# Patient Record
Sex: Male | Born: 1960 | Race: White | Hispanic: No | Smoking: Former smoker
Health system: Southern US, Community
[De-identification: ages and names within clinical notes are randomized; demographics above are authoritative.]

## PROBLEM LIST (undated history)

## (undated) DIAGNOSIS — F419 Anxiety disorder, unspecified: Secondary | ICD-10-CM

## (undated) DIAGNOSIS — E291 Testicular hypofunction: Secondary | ICD-10-CM

## (undated) DIAGNOSIS — M109 Gout, unspecified: Secondary | ICD-10-CM

## (undated) DIAGNOSIS — E785 Hyperlipidemia, unspecified: Secondary | ICD-10-CM

## (undated) DIAGNOSIS — F329 Major depressive disorder, single episode, unspecified: Secondary | ICD-10-CM

## (undated) DIAGNOSIS — I10 Essential (primary) hypertension: Secondary | ICD-10-CM

## (undated) DIAGNOSIS — F32A Depression, unspecified: Secondary | ICD-10-CM

## (undated) HISTORY — DX: Hyperlipidemia, unspecified: E78.5

## (undated) HISTORY — DX: Major depressive disorder, single episode, unspecified: F32.9

## (undated) HISTORY — DX: Essential (primary) hypertension: I10

## (undated) HISTORY — DX: Depression, unspecified: F32.A

## (undated) HISTORY — DX: Anxiety disorder, unspecified: F41.9

## (undated) HISTORY — DX: Gout, unspecified: M10.9

## (undated) HISTORY — DX: Testicular hypofunction: E29.1

---

## 1999-02-07 ENCOUNTER — Ambulatory Visit (HOSPITAL_COMMUNITY): Admission: RE | Admit: 1999-02-07 | Discharge: 1999-02-07 | Payer: Self-pay | Admitting: Internal Medicine

## 1999-02-07 ENCOUNTER — Encounter: Payer: Self-pay | Admitting: Internal Medicine

## 2001-05-22 HISTORY — PX: ORIF ANKLE FRACTURE: SHX5408

## 2001-07-19 ENCOUNTER — Encounter: Payer: Self-pay | Admitting: Emergency Medicine

## 2001-07-20 ENCOUNTER — Encounter: Payer: Self-pay | Admitting: Orthopedic Surgery

## 2001-07-20 ENCOUNTER — Inpatient Hospital Stay (HOSPITAL_COMMUNITY): Admission: EM | Admit: 2001-07-20 | Discharge: 2001-07-21 | Payer: Self-pay | Admitting: Orthopedic Surgery

## 2006-07-21 ENCOUNTER — Emergency Department (HOSPITAL_COMMUNITY): Admission: EM | Admit: 2006-07-21 | Discharge: 2006-07-21 | Payer: Self-pay | Admitting: *Deleted

## 2006-07-23 ENCOUNTER — Ambulatory Visit (HOSPITAL_COMMUNITY): Admission: RE | Admit: 2006-07-23 | Discharge: 2006-07-23 | Payer: Self-pay | Admitting: Orthopedic Surgery

## 2006-08-31 ENCOUNTER — Encounter (INDEPENDENT_AMBULATORY_CARE_PROVIDER_SITE_OTHER): Payer: Self-pay | Admitting: Specialist

## 2006-08-31 ENCOUNTER — Ambulatory Visit (HOSPITAL_BASED_OUTPATIENT_CLINIC_OR_DEPARTMENT_OTHER): Admission: RE | Admit: 2006-08-31 | Discharge: 2006-08-31 | Payer: Self-pay | Admitting: Otolaryngology
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
## 2007-06-04 IMAGING — CR DG ORBITS FOR FOREIGN BODY
2 series · 2 of 2 positions shown · non-contrast
Comparison: none

CLINICAL DATA: Pre-MRI, screening examination.
 ORBITS - 2 VIEW:

[view not recorded (1 of 2)]
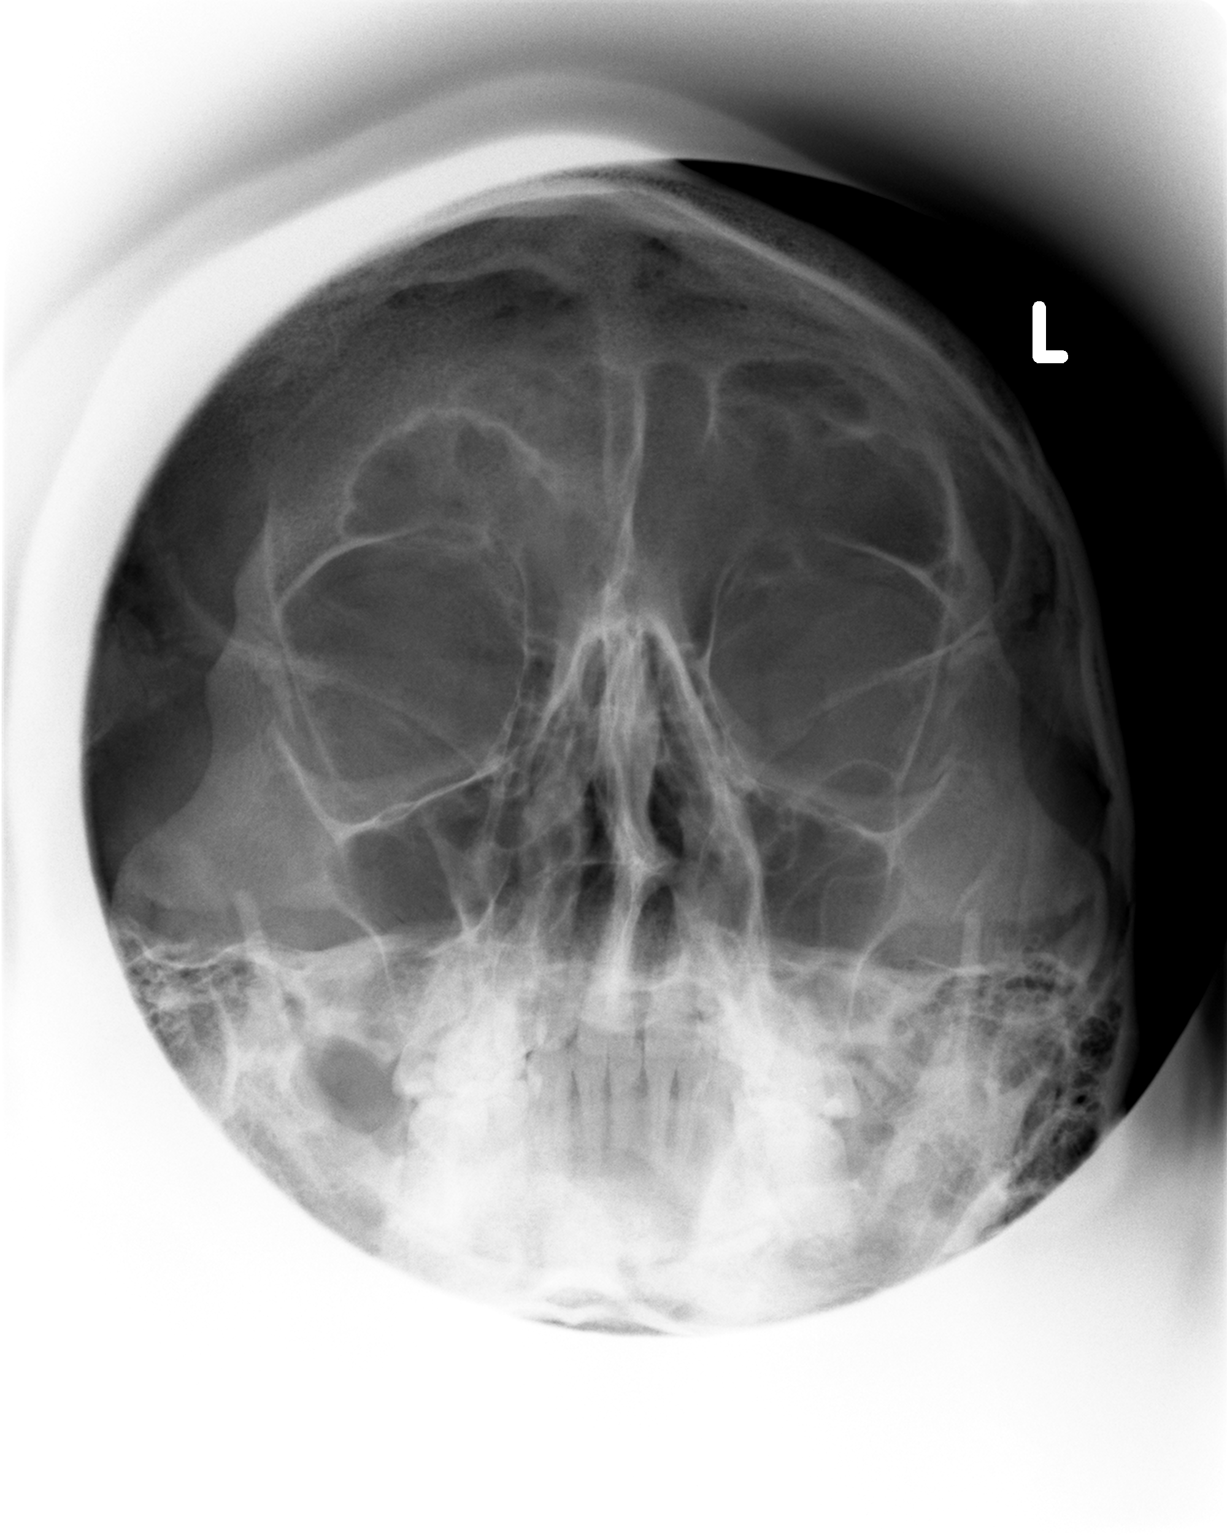

[view not recorded (2 of 2)]
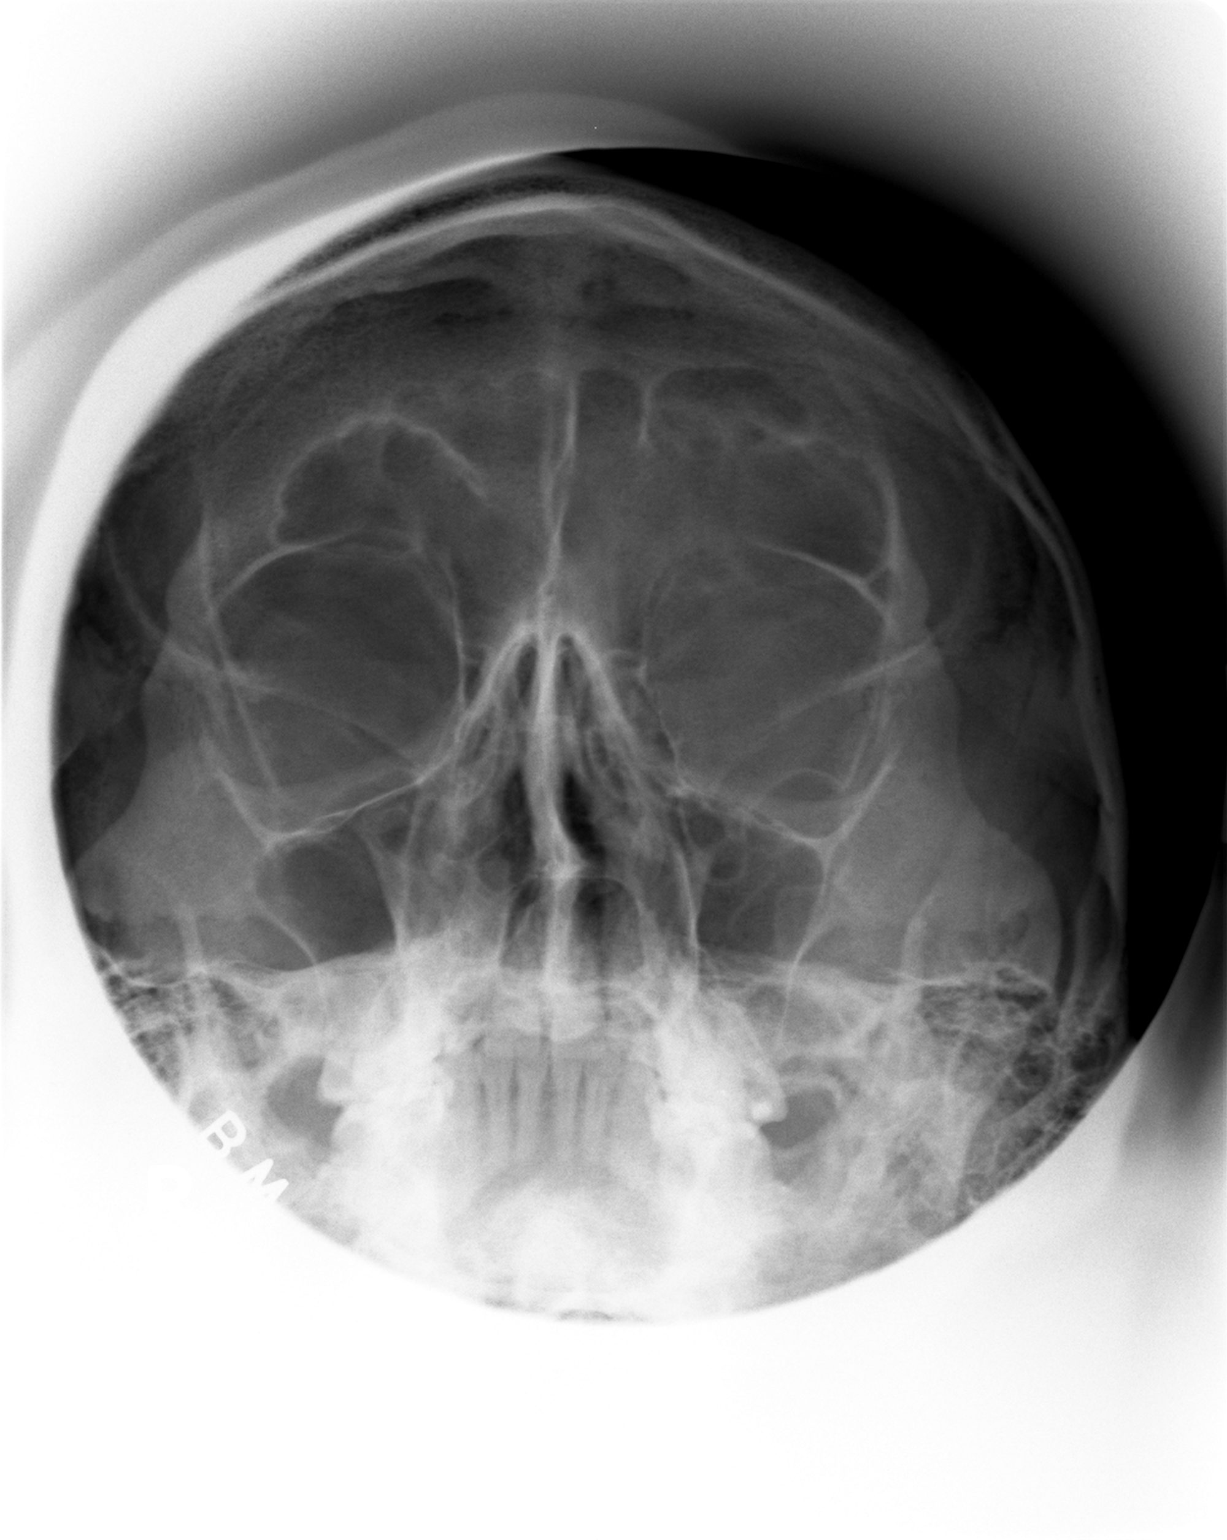

[2 of 2 positions shown; findings below may reference images not displayed]

FINDINGS: No radiopaque foreign body.
IMPRESSION: Patient may proceed to MRI.

## 2010-10-07 NOTE — Op Note (Signed)
[place]. [facility]  Patient:    [name], [name] Visit Number: [id number] MRN: [id number]          Service Type: SUR Location: 5000 5023 [id number] Attending Physician:  Burnard Bunting Dictated by:   Cammy Copa, M.D. Proc. Date: 07/20/01 Admit Date:  07/20/2001 Discharge Date: 07/21/2001                             Operative Report  PREOPERATIVE DIAGNOSIS:   Right ankle fracture.  POSTOPERATIVE DIAGNOSIS:  Right ankle fracture.  OPERATION:  Right ankle fracture open reduction and internal fixation.  SURGEON:  Cammy Copa, M.D.  ANESTHESIA:  General endotracheal.  ESTIMATED BLOOD LOSS:   20 cc.  DRAINS:  None.  TOURNIQUET TIME:  59 minutes at 300 mmHg.  INDICATIONS:  Trevor Reynolds is a 50 year old patient who injured his right ankle.  He presents now for operative management.  DESCRIPTION OF PROCEDURE:  The patient was brought to the operating room where general endotracheal anesthesia.  Preoperative IV antibiotics were administered.  The right leg was prepped with DuraPrep solution and draped in a sterile manner.  The operative field was then covered with a Ioban.  The leg was elevated and exsanguinated with the Esmarch.  The tourniquet was inflated. A lateral incision was made at 0.9 cm proximal to the fibula.  The fibula was marked.  The skin and subcutaneous tissues were sharply divided 9 cm proximal to the fibular tip.  Care was taken to avoid the superficial peroneal nerve sensory branch.  The periosteum was elevated anteriorly and posteriorly.  The fracture site was visualized.  It was reduced under direct visualization in an anatomic fashion.  Two lag screws were placed proximal anterior to distal posterior.  Excellent compression was achieved.  An 8-hole one-third tubular plate was then fashioned as a neutralization plate.  Blocking plate was used for optimal stability.  The construct was checked in the AP and lateral  plains and anatomic reduction was confirmed.  Pull test on the fibula demonstrated intact syndesmosis.  The mortise was symmetric on the AP and lateral views. The tourniquet was released.  Bleeding points were encountered and controlled using electrocautery.  The periosteum was closed over the plate.  This was done using 3-0 Vicryl suture.  Inverted interrupted 3-0 Vicryl was then used to reapproximate the skin edges; 3-0 nylon was used to reapproximate the skin itself.  The patient was then placed in a bulky posterior splint.  The patient tolerated the procedure well without immediate complications. Dictated by:   Cammy Copa, M.D. Attending Physician:  Burnard Bunting DD:  07/20/01 TD:  07/22/01 Job: [id number]
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
## 2010-10-07 NOTE — Op Note (Signed)
NAMEKEANU, LESNIAK NO.:  [id number]   MEDICAL RECORD NO.:  [id number]           PATIENT TYPE:   LOCATION:                                 FACILITY:   PHYSICIAN:  Christopher E. Ezzard Standing, M.D.DATE OF BIRTH:  [birth year]   DATE OF PROCEDURE:  08/31/2006  DATE OF DISCHARGE:                               OPERATIVE REPORT   PREOP DIAGNOSIS:  Left postauricular mass consistent with lipoma.   POSTOP DIAGNOSIS:  Left postauricular mass consistent with lipoma.   OPERATIONS:  Excision of left postauricular mass, 3-4-cm size.   SURGEON:  Narda Bonds, M.D.   ANESTHESIA:  Local, 1% Xylocaine with epinephrine.   COMPLICATIONS:  None.   BRIEF CLINICAL NOTE:  Trevor Reynolds is a 50 year old gentleman who  presents because of a large lump behind his left ear that measures  approximately 3-4 cm in size.  He has had this for a number of years and  on exam is consistent with a large lipoma.  This has been bothered.  He  would like to get it removed.  He is taken to the operating room at this  time for excision of the left postauricular lipoma.   DESCRIPTION OF PROCEDURE:  The patient was brought back. The area was  prepped with Betadine solution.  The proposed incision was marked right  at the edge of the hairline and the area was injected with 6 mL of  Xylocaine with epinephrine.  Incision was made directly over the lipoma.  The subcutaneous flaps were elevated and a large lipoma on top of the  neck fascia muscles was encountered.  This was dissected out.  Cautery  was used for hemostasis.  Specimen was sent to pathology and defect was  closed with 3-0 chromic sutures subcutaneously and 5-0 nylon to  reapproximate the skin edges.  Trevor Reynolds tolerated this well.  He was  subsequently discharged home.   DISPOSITION:  Trevor Reynolds will follow-up in my office in 1 week to have his  sutures removed and review pathology.  He is given Tylenol and Tylenol  #3 p.r.n. pain.     ______________________________  Kristine Garbe. Ezzard Standing, M.D.     CEN/MEDQ  D:  08/31/2006  T:  08/31/2006  Job:  [id number]   cc:   Kristine Garbe. Ezzard Standing, M.D.

## 2013-05-05 ENCOUNTER — Other Ambulatory Visit: Payer: Self-pay | Admitting: Internal Medicine

## 2013-05-06 ENCOUNTER — Other Ambulatory Visit: Payer: Self-pay | Admitting: Internal Medicine

## 2013-05-27 ENCOUNTER — Other Ambulatory Visit: Payer: Self-pay | Admitting: Internal Medicine

## 2013-06-11 DIAGNOSIS — E785 Hyperlipidemia, unspecified: Secondary | ICD-10-CM | POA: Insufficient documentation

## 2013-06-11 DIAGNOSIS — F32A Depression, unspecified: Secondary | ICD-10-CM | POA: Insufficient documentation

## 2013-06-11 DIAGNOSIS — F419 Anxiety disorder, unspecified: Secondary | ICD-10-CM | POA: Insufficient documentation

## 2013-06-11 DIAGNOSIS — F329 Major depressive disorder, single episode, unspecified: Secondary | ICD-10-CM | POA: Insufficient documentation

## 2013-06-11 DIAGNOSIS — I10 Essential (primary) hypertension: Secondary | ICD-10-CM | POA: Insufficient documentation

## 2013-06-11 DIAGNOSIS — E291 Testicular hypofunction: Secondary | ICD-10-CM | POA: Insufficient documentation

## 2013-06-11 DIAGNOSIS — M109 Gout, unspecified: Secondary | ICD-10-CM | POA: Insufficient documentation

## 2013-06-12 ENCOUNTER — Encounter: Payer: Self-pay | Admitting: Internal Medicine

## 2013-06-12 ENCOUNTER — Ambulatory Visit (INDEPENDENT_AMBULATORY_CARE_PROVIDER_SITE_OTHER): Payer: 59 | Admitting: Internal Medicine

## 2013-06-12 VITALS — BP 126/84 | HR 76 | Temp 97.9°F | Resp 18 | Wt 256.0 lb

## 2013-06-12 DIAGNOSIS — I10 Essential (primary) hypertension: Secondary | ICD-10-CM

## 2013-06-12 DIAGNOSIS — E559 Vitamin D deficiency, unspecified: Secondary | ICD-10-CM

## 2013-06-12 DIAGNOSIS — Z79899 Other long term (current) drug therapy: Secondary | ICD-10-CM

## 2013-06-12 DIAGNOSIS — E291 Testicular hypofunction: Secondary | ICD-10-CM

## 2013-06-12 DIAGNOSIS — E782 Mixed hyperlipidemia: Secondary | ICD-10-CM

## 2013-06-12 DIAGNOSIS — R7309 Other abnormal glucose: Secondary | ICD-10-CM | POA: Insufficient documentation

## 2013-06-12 LAB — CBC WITH DIFFERENTIAL/PLATELET
Basophils Absolute: 0 10*3/uL (ref 0.0–0.1)
Basophils Relative: 1 % (ref 0–1)
Eosinophils Absolute: 0 10*3/uL (ref 0.0–0.7)
Eosinophils Relative: 1 % (ref 0–5)
HCT: 41.6 % (ref 39.0–52.0)
HEMOGLOBIN: 14.4 g/dL (ref 13.0–17.0)
LYMPHS ABS: 2 10*3/uL (ref 0.7–4.0)
Lymphocytes Relative: 31 % (ref 12–46)
MCH: 31.9 pg (ref 26.0–34.0)
MCHC: 34.6 g/dL (ref 30.0–36.0)
MCV: 92 fL (ref 78.0–100.0)
MONOS PCT: 9 % (ref 3–12)
Monocytes Absolute: 0.6 10*3/uL (ref 0.1–1.0)
NEUTROS ABS: 3.8 10*3/uL (ref 1.7–7.7)
NEUTROS PCT: 58 % (ref 43–77)
Platelets: 252 10*3/uL (ref 150–400)
RBC: 4.52 MIL/uL (ref 4.22–5.81)
RDW: 14.2 % (ref 11.5–15.5)
WBC: 6.5 10*3/uL (ref 4.0–10.5)

## 2013-06-12 LAB — HEMOGLOBIN A1C
Hgb A1c MFr Bld: 6 % — ABNORMAL HIGH (ref <5.7)
Mean Plasma Glucose: 126 mg/dL — ABNORMAL HIGH (ref <117)

## 2013-06-12 NOTE — Progress Notes (Signed)
Patient ID: Trevor Reynolds, male   DOB: 1961/04/27, 53 y.o.   MRN: [id number]   This very nice 53 y.o. MWM presents for 3 month follow up with Hypertension, Hyperlipidemia, Pre-Diabetes, Testosterone Deficiency  and Vitamin D Deficiency.    HTN predates since 1997. BP has been controlled at home. Today's BP: 126/84 mmHg . Patient denies any cardiac type chest pain, palpitations, dyspnea/orthopnea/PND, dizziness, claudication, or dependent edema.   Hyperlipidemia is controlled with diet & meds. Last Cholesterol was  131, Triglycerides were 137, HDL 62 and LDL 42 in Oct 2014 - all at goal. Patient denies myalgias or other med SE's. He also has Hx/o gout controlled on allopurinol.   Also, the patient has history of PreDiabetes with A1c 6.2% since 02/2007 with last A1c of 6.4% in Oct 2014. He attempts control by dieting. Patient denies any symptoms of reactive hypoglycemia, diabetic polys, paresthesias or visual blurring.   Further, Patient has history of Vitamin D Deficiency of 35 in 2008 with last vitamin D of 110 in Oct and he was advised to taper his dose. Patient supplements vitamin D without any suspected side-effects.    Medication List         allopurinol 300 MG tablet  Commonly known as:  ZYLOPRIM  Take 300 mg by mouth daily.     ALPRAZolam 1 MG tablet  Commonly known as:  XANAX  TAKE ONE-HALF TO ONE TABLET BY MOUTH THREE TIMES DAILY AS NEEDED FOR ANXIETY     atenolol 100 MG tablet  Commonly known as:  TENORMIN  TAKE ONE TABLET BY MOUTH EVERY DAY     enalapril 20 MG tablet  Commonly known as:  VASOTEC  Take 20 mg by mouth daily.     FLUoxetine 40 MG capsule  Commonly known as:  PROZAC  Take 40 mg by mouth 2 (two) times daily.     hydrochlorothiazide 25 MG tablet  Commonly known as:  HYDRODIURIL  Take 25 mg by mouth daily.     rosuvastatin 40 MG tablet  Commonly known as:  CRESTOR  Take 40 mg by mouth daily.     testosterone cypionate 200 MG/ML injection  Commonly known  as:  DEPOTESTOTERONE CYPIONATE  Inject 400 mg into the muscle every 14 (fourteen) days.     VITAMIN D PO  Take 5,000 Int'l Units by mouth daily.         Allergies  Allergen Reactions  . Lipitor [Atorvastatin]     PMHx:   Past Medical History  Diagnosis Date  . Hypertension   . Hyperlipidemia   . Other testicular hypofunction   . Gout   . Depression   . Anxiety     FHx:    Reviewed / unchanged  SHx:    Reviewed / unchanged  Systems Review: Constitutional: Denies fever, chills, wt changes, headaches, insomnia, fatigue, night sweats, change in appetite. Eyes: Denies redness, blurred vision, diplopia, discharge, itchy, watery eyes.  ENT: Denies discharge, congestion, post nasal drip, epistaxis, sore throat, earache, hearing loss, dental pain, tinnitus, vertigo, sinus pain, snoring.  CV: Denies chest pain, palpitations, irregular heartbeat, syncope, dyspnea, diaphoresis, orthopnea, PND, claudication, edema. Respiratory: denies cough, dyspnea, DOE, pleurisy, hoarseness, laryngitis, wheezing.  Gastrointestinal: Denies dysphagia, odynophagia, heartburn, reflux, water brash, abdominal pain or cramps, nausea, vomiting, bloating, diarrhea, constipation, hematemesis, melena, hematochezia,  or hemorrhoids. Genitourinary: Denies dysuria, frequency, urgency, nocturia, hesitancy, discharge, hematuria, flank pain. Musculoskeletal: Denies arthralgias, myalgias, stiffness, jt. swelling, pain, limp, strain/sprain.  Skin: Denies pruritus,  rash, hives, warts, acne, eczema, change in skin lesion(s). Neuro: No weakness, tremor, incoordination, spasms, paresthesia, or pain. Psychiatric: Denies confusion, memory loss, or sensory loss. Endo: Denies change in weight, skin, hair change.  Heme/Lymph: No excessive bleeding, bruising, orenlarged lymph nodes.  BP: 126/84  Pulse: 76  Temp: 97.9 F (36.6 C)  Resp: 18      On Exam:  Appears well nourished - in no distress. Eyes: PERRLA, EOMs,  conjunctiva no swelling or erythema. Sinuses: No frontal/maxillary tenderness ENT/Mouth: EAC's clear, TM's nl w/o erythema, bulging. Nares clear w/o erythema, swelling, exudates. Oropharynx clear without erythema or exudates. Oral hygiene is good. Tongue normal, non obstructing. Hearing intact.  Neck: Supple. Thyroid nl. Car 2+/2+ without bruits, nodes or JVD. Chest: Respirations nl with BS clear & equal w/o rales, rhonchi, wheezing or stridor.  Cor: Heart sounds normal w/ regular rate and rhythm without sig. murmurs, gallops, clicks, or rubs. Peripheral pulses normal and equal  without edema.  Abdomen: Soft & bowel sounds normal. Non-tender w/o guarding, rebound, hernias, masses, or organomegaly.  Lymphatics: Unremarkable.  Musculoskeletal: Full ROM all peripheral extremities, joint stability, 5/5 strength, and normal gait.  Skin: Warm, dry without exposed rashes, lesions, ecchymosis apparent.  Neuro: Cranial nerves intact, reflexes equal bilaterally. Sensory-motor testing grossly intact. Tendon reflexes grossly intact.  Pysch: Alert & oriented x 3. Insight and judgement nl & appropriate. No ideations.  Assessment and Plan:  1. Hypertension - Continue monitor blood pressure at home. Continue diet/meds same.  2. Hyperlipidemia - Continue diet/meds, exercise,& lifestyle modifications. Continue monitor periodic cholesterol/liver & renal functions   3. Pre-diabetes - Continue diet, exercise, lifestyle modifications. Monitor appropriate labs.  4. Testosterone Deficiency  5. Vitamin D Deficiency - Continue supplementation.  Recommended regular exercise, BP monitoring, weight control, and discussed med and SE's. Recommended labs to assess and monitor clinical status. Further disposition pending results of labs.

## 2013-06-12 NOTE — Patient Instructions (Signed)
 Testosterone injection What is this medicine? TESTOSTERONE (tes TOS ter one) is the main male hormone. It supports normal male development such as muscle growth, facial hair, and deep voice. It is used in males to treat low testosterone levels. This medicine may be used for other purposes; ask your health care provider or pharmacist if you have questions. COMMON BRAND NAME(S): Andro-L.A., Aveed, Delatestryl, Depo-Testosterone, Virilon What should I tell my health care provider before I take this medicine? They need to know if you have any of these conditions: -breast cancer -diabetes -heart disease -kidney disease -liver disease -lung disease -prostate cancer, enlargement -an unusual or allergic reaction to testosterone, other medicines, foods, dyes, or preservatives -pregnant or trying to get pregnant -breast-feeding How should I use this medicine? This medicine is for injection into a muscle. It is usually given by a health care professional in a hospital or clinic setting. Contact your pediatrician regarding the use of this medicine in children. While this medicine may be prescribed for children as young as 12 years of age for selected conditions, precautions do apply. Overdosage: If you think you have taken too much of this medicine contact a poison control center or emergency room at once. NOTE: This medicine is only for you. Do not share this medicine with others. What if I miss a dose? Try not to miss a dose. Your doctor or health care professional will tell you when your next injection is due. Notify the office if you are unable to keep an appointment. What may interact with this medicine? -medicines for diabetes -medicines that treat or prevent blood clots like warfarin -oxyphenbutazone -propranolol -steroid medicines like prednisone or cortisone This list may not describe all possible interactions. Give your health care provider a list of all the medicines, herbs,  non-prescription drugs, or dietary supplements you use. Also tell them if you smoke, drink alcohol, or use illegal drugs. Some items may interact with your medicine. What should I watch for while using this medicine? Visit your doctor or health care professional for regular checks on your progress. They will need to check the level of testosterone in your blood. This medicine may affect blood sugar levels. If you have diabetes, check with your doctor or health care professional before you change your diet or the dose of your diabetic medicine. This drug is banned from use in athletes by most athletic organizations. What side effects may I notice from receiving this medicine? Side effects that you should report to your doctor or health care professional as soon as possible: -allergic reactions like skin rash, itching or hives, swelling of the face, lips, or tongue -breast enlargement -breathing problems -changes in mood, especially anger, depression, or rage -dark urine -general ill feeling or flu-like symptoms -light-colored stools -loss of appetite, nausea -nausea, vomiting -right upper belly pain -stomach pain -swelling of ankles -too frequent or persistent erections -trouble passing urine or change in the amount of urine -unusually weak or tired -yellowing of the eyes or skin Additional side effects that can occur in women include: -deep or hoarse voice -facial hair growth -irregular menstrual periods Side effects that usually do not require medical attention (report to your doctor or health care professional if they continue or are bothersome): -acne -change in sex drive or performance -hair loss -headache This list may not describe all possible side effects. Call your doctor for medical advice about side effects. You may report side effects to FDA at 1-800-FDA-1088. Where should I keep my medicine?   Keep out of the reach of children. This medicine can be abused. Keep your  medicine in a safe place to protect it from theft. Do not share this medicine with anyone. Selling or giving away this medicine is dangerous and against the law. Store at room temperature between 20 and 25 degrees C (68 and 77 degrees F). Do not freeze. Protect from light. Follow the directions for the product you are prescribed. Throw away any unused medicine after the expiration date. NOTE: This sheet is a summary. It may not cover all possible information. If you have questions about this medicine, talk to your doctor, pharmacist, or health care provider.  2014, Elsevier/Gold Standard. (2007-07-19 16:13:46)  Hypertension As your heart beats, it forces blood through your arteries. This force is your blood pressure. If the pressure is too high, it is called hypertension (HTN) or high blood pressure. HTN is dangerous because you may have it and not know it. High blood pressure may mean that your heart has to work harder to pump blood. Your arteries may be narrow or stiff. The extra work puts you at risk for heart disease, stroke, and other problems.  Blood pressure consists of two numbers, a higher number over a lower, 110/72, for example. It is stated as "110 over 72." The ideal is below 120 for the top number (systolic) and under 80 for the bottom (diastolic). Write down your blood pressure today. You should pay close attention to your blood pressure if you have certain conditions such as:  Heart failure.  Prior heart attack.  Diabetes  Chronic kidney disease.  Prior stroke.  Multiple risk factors for heart disease. To see if you have HTN, your blood pressure should be measured while you are seated with your arm held at the level of the heart. It should be measured at least twice. A one-time elevated blood pressure reading (especially in the Emergency Department) does not mean that you need treatment. There may be conditions in which the blood pressure is different between your right and left  arms. It is important to see your caregiver soon for a recheck. Most people have essential hypertension which means that there is not a specific cause. This type of high blood pressure may be lowered by changing lifestyle factors such as:  Stress.  Smoking.  Lack of exercise.  Excessive weight.  Drug/tobacco/alcohol use.  Eating less salt. Most people do not have symptoms from high blood pressure until it has caused damage to the body. Effective treatment can often prevent, delay or reduce that damage. TREATMENT  When a cause has been identified, treatment for high blood pressure is directed at the cause. There are a large number of medications to treat HTN. These fall into several categories, and your caregiver will help you select the medicines that are best for you. Medications may have side effects. You should review side effects with your caregiver. If your blood pressure stays high after you have made lifestyle changes or started on medicines,   Your medication(s) may need to be changed.  Other problems may need to be addressed.  Be certain you understand your prescriptions, and know how and when to take your medicine.  Be sure to follow up with your caregiver within the time frame advised (usually within two weeks) to have your blood pressure rechecked and to review your medications.  If you are taking more than one medicine to lower your blood pressure, make sure you know how and at what times   they should be taken. Taking two medicines at the same time can result in blood pressure that is too low. SEEK IMMEDIATE MEDICAL CARE IF:  You develop a severe headache, blurred or changing vision, or confusion.  You have unusual weakness or numbness, or a faint feeling.  You have severe chest or abdominal pain, vomiting, or breathing problems. MAKE SURE YOU:   Understand these instructions.  Will watch your condition.  Will get help right away if you are not doing well or get  worse.   Diabetes and Exercise Exercising regularly is important. It is not just about losing weight. It has many health benefits, such as:  Improving your overall fitness, flexibility, and endurance.  Increasing your bone density.  Helping with weight control.  Decreasing your body fat.  Increasing your muscle strength.  Reducing stress and tension.  Improving your overall health. People with diabetes who exercise gain additional benefits because exercise:  Reduces appetite.  Improves the body's use of blood sugar (glucose).  Helps lower or control blood glucose.  Decreases blood pressure.  Helps control blood lipids (such as cholesterol and triglycerides).  Improves the body's use of the hormone insulin by:  Increasing the body's insulin sensitivity.  Reducing the body's insulin needs.  Decreases the risk for heart disease because exercising:  Lowers cholesterol and triglycerides levels.  Increases the levels of good cholesterol (such as high-density lipoproteins [HDL]) in the body.  Lowers blood glucose levels. YOUR ACTIVITY PLAN  Choose an activity that you enjoy and set realistic goals. Your health care provider or diabetes educator can help you make an activity plan that works for you. You can break activities into 2 or 3 sessions throughout the day. Doing so is as good as one long session. Exercise ideas include:  Taking the dog for a walk.  Taking the stairs instead of the elevator.  Dancing to your favorite song.  Doing your favorite exercise with a friend. RECOMMENDATIONS FOR EXERCISING WITH TYPE 1 OR TYPE 2 DIABETES   Check your blood glucose before exercising. If blood glucose levels are greater than 240 mg/dL, check for urine ketones. Do not exercise if ketones are present.  Avoid injecting insulin into areas of the body that are going to be exercised. For example, avoid injecting insulin into:  The arms when playing tennis.  The legs when  jogging.  Keep a record of:  Food intake before and after you exercise.  Expected peak times of insulin action.  Blood glucose levels before and after you exercise.  The type and amount of exercise you have done.  Review your records with your health care provider. Your health care provider will help you to develop guidelines for adjusting food intake and insulin amounts before and after exercising.  If you take insulin or oral hypoglycemic agents, watch for signs and symptoms of hypoglycemia. They include:  Dizziness.  Shaking.  Sweating.  Chills.  Confusion.  Drink plenty of water while you exercise to prevent dehydration or heat stroke. Body water is lost during exercise and must be replaced.  Talk to your health care provider before starting an exercise program to make sure it is safe for you. Remember, almost any type of activity is better than none.    Cholesterol Cholesterol is a white, waxy, fat-like protein needed by your body in small amounts. The liver makes all the cholesterol you need. It is carried from the liver by the blood through the blood vessels. Deposits (plaque) may build   up on blood vessel walls. This makes the arteries narrower and stiffer. Plaque increases the risk for heart attack and stroke. You cannot feel your cholesterol level even if it is very high. The only way to know is by a blood test to check your lipid (fats) levels. Once you know your cholesterol levels, you should keep a record of the test results. Work with your caregiver to to keep your levels in the desired range. WHAT THE RESULTS MEAN:  Total cholesterol is a rough measure of all the cholesterol in your blood.  LDL is the so-called bad cholesterol. This is the type that deposits cholesterol in the walls of the arteries. You want this level to be low.  HDL is the good cholesterol because it cleans the arteries and carries the LDL away. You want this level to be high.  Triglycerides  are fat that the body can either burn for energy or store. High levels are closely linked to heart disease. DESIRED LEVELS:  Total cholesterol below 200.  LDL below 100 for people at risk, below 70 for very high risk.  HDL above 50 is good, above 60 is best.  Triglycerides below 150. HOW TO LOWER YOUR CHOLESTEROL:  Diet.  Choose fish or white meat chicken and turkey, roasted or baked. Limit fatty cuts of red meat, fried foods, and processed meats, such as sausage and lunch meat.  Eat lots of fresh fruits and vegetables. Choose whole grains, beans, pasta, potatoes and cereals.  Use only small amounts of olive, corn or canola oils. Avoid butter, mayonnaise, shortening or palm kernel oils. Avoid foods with trans-fats.  Use skim/nonfat milk and low-fat/nonfat yogurt and cheeses. Avoid whole milk, cream, ice cream, egg yolks and cheeses. Healthy desserts include angel food cake, ginger snaps, animal crackers, hard candy, popsicles, and low-fat/nonfat frozen yogurt. Avoid pastries, cakes, pies and cookies.  Exercise.  A regular program helps decrease LDL and raises HDL.  Helps with weight control.  Do things that increase your activity level like gardening, walking, or taking the stairs.  Medication.  May be prescribed by your caregiver to help lowering cholesterol and the risk for heart disease.  You may need medicine even if your levels are normal if you have several risk factors. HOME CARE INSTRUCTIONS   Follow your diet and exercise programs as suggested by your caregiver.  Take medications as directed.  Have blood work done when your caregiver feels it is necessary. MAKE SURE YOU:   Understand these instructions.  Will watch your condition.  Will get help right away if you are not doing well or get worse.      Vitamin D Deficiency Vitamin D is an important vitamin that your body needs. Having too little of it in your body is called a deficiency. A very bad  deficiency can make your bones soft and can cause a condition called rickets.  Vitamin D is important to your body for different reasons, such as:   It helps your body absorb 2 minerals called calcium and phosphorus.  It helps make your bones healthy.  It may prevent some diseases, such as diabetes and multiple sclerosis.  It helps your muscles and heart. You can get vitamin D in several ways. It is a natural part of some foods. The vitamin is also added to some dairy products and cereals. Some people take vitamin D supplements. Also, your body makes vitamin D when you are in the sun. It changes the sun's rays into a   form of the vitamin that your body can use. CAUSES   Not eating enough foods that contain vitamin D.  Not getting enough sunlight.  Having certain digestive system diseases that make it hard to absorb vitamin D. These diseases include Crohn's disease, chronic pancreatitis, and cystic fibrosis.  Having a surgery in which part of the stomach or small intestine is removed.  Being obese. Fat cells pull vitamin D out of your blood. That means that obese people may not have enough vitamin D left in their blood and in other body tissues.  Having chronic kidney or liver disease. RISK FACTORS Risk factors are things that make you more likely to develop a vitamin D deficiency. They include:  Being older.  Not being able to get outside very much.  Living in a nursing home.  Having had broken bones.  Having weak or thin bones (osteoporosis).  Having a disease or condition that changes how your body absorbs vitamin D.  Having dark skin.  Some medicines such as seizure medicines or steroids.  Being overweight or obese. SYMPTOMS Mild cases of vitamin D deficiency may not have any symptoms. If you have a very bad case, symptoms may include:  Bone pain.  Muscle pain.  Falling often.  Broken bones caused by a minor injury, due to osteoporosis. DIAGNOSIS A blood test  is the best way to tell if you have a vitamin D deficiency. TREATMENT Vitamin D deficiency can be treated in different ways. Treatment for vitamin D deficiency depends on what is causing it. Options include:  Taking vitamin D supplements.  Taking a calcium supplement. Your caregiver will suggest what dose is best for you. HOME CARE INSTRUCTIONS  Take any supplements that your caregiver prescribes. Follow the directions carefully. Take only the suggested amount.  Have your blood tested 2 months after you start taking supplements.  Eat foods that contain vitamin D. Healthy choices include:  Fortified dairy products, cereals, or juices. Fortified means vitamin D has been added to the food. Check the label on the package to be sure.  Fatty fish like salmon or trout.  Eggs.  Oysters.  Do not use a tanning bed.  Keep your weight at a healthy level. Lose weight if you need to.  Keep all follow-up appointments. Your caregiver will need to perform blood tests to make sure your vitamin D deficiency is going away. SEEK MEDICAL CARE IF:  You have any questions about your treatment.  You continue to have symptoms of vitamin D deficiency.  You have nausea or vomiting.  You are constipated.  You feel confused.  You have severe abdominal or back pain. MAKE SURE YOU:  Understand these instructions.  Will watch your condition.  Will get help right away if you are not doing well or get worse.   

## 2013-06-13 LAB — HEPATIC FUNCTION PANEL
ALT: 20 U/L (ref 0–53)
AST: 34 U/L (ref 0–37)
Albumin: 4.7 g/dL (ref 3.5–5.2)
Alkaline Phosphatase: 42 U/L (ref 39–117)
BILIRUBIN DIRECT: 0.1 mg/dL (ref 0.0–0.3)
BILIRUBIN INDIRECT: 0.3 mg/dL (ref 0.0–0.9)
BILIRUBIN TOTAL: 0.4 mg/dL (ref 0.3–1.2)
Total Protein: 7.4 g/dL (ref 6.0–8.3)

## 2013-06-13 LAB — BASIC METABOLIC PANEL WITH GFR
BUN: 22 mg/dL (ref 6–23)
CO2: 29 meq/L (ref 19–32)
Calcium: 10 mg/dL (ref 8.4–10.5)
Chloride: 101 mEq/L (ref 96–112)
Creat: 1.15 mg/dL (ref 0.50–1.35)
GFR, EST AFRICAN AMERICAN: 84 mL/min
GFR, Est Non African American: 73 mL/min
GLUCOSE: 107 mg/dL — AB (ref 70–99)
POTASSIUM: 4.5 meq/L (ref 3.5–5.3)
SODIUM: 141 meq/L (ref 135–145)

## 2013-06-13 LAB — LIPID PANEL
CHOL/HDL RATIO: 2.2 ratio
CHOLESTEROL: 140 mg/dL (ref 0–200)
HDL: 63 mg/dL (ref >39)
LDL Cholesterol: 56 mg/dL (ref 0–99)
Triglycerides: 103 mg/dL (ref <150)
VLDL: 21 mg/dL (ref 0–40)

## 2013-06-13 LAB — MAGNESIUM: Magnesium: 1.5 mg/dL (ref 1.5–2.5)

## 2013-06-13 LAB — VITAMIN D 25 HYDROXY (VIT D DEFICIENCY, FRACTURES): Vit D, 25-Hydroxy: 95 ng/mL — ABNORMAL HIGH (ref 30–89)

## 2013-06-13 LAB — INSULIN, FASTING: INSULIN FASTING, SERUM: 58 u[IU]/mL — AB (ref 3–28)

## 2013-06-13 LAB — TESTOSTERONE: TESTOSTERONE: 332 ng/dL (ref 300–890)

## 2013-06-13 LAB — TSH: TSH: 2.704 u[IU]/mL (ref 0.350–4.500)

## 2013-06-19 ENCOUNTER — Other Ambulatory Visit: Payer: Self-pay | Admitting: Physician Assistant

## 2013-07-25 ENCOUNTER — Other Ambulatory Visit: Payer: Self-pay | Admitting: Internal Medicine

## 2013-07-25 ENCOUNTER — Other Ambulatory Visit: Payer: Self-pay | Admitting: Emergency Medicine

## 2013-08-25 ENCOUNTER — Other Ambulatory Visit: Payer: Self-pay | Admitting: Internal Medicine

## 2013-08-28 ENCOUNTER — Other Ambulatory Visit: Payer: Self-pay | Admitting: Emergency Medicine

## 2013-08-28 ENCOUNTER — Other Ambulatory Visit: Payer: Self-pay | Admitting: Physician Assistant

## 2013-09-10 ENCOUNTER — Encounter: Payer: Self-pay | Admitting: Emergency Medicine

## 2013-09-10 ENCOUNTER — Ambulatory Visit (INDEPENDENT_AMBULATORY_CARE_PROVIDER_SITE_OTHER): Payer: 59 | Admitting: Emergency Medicine

## 2013-09-10 VITALS — BP 150/98 | HR 78 | Temp 98.4°F | Resp 18 | Ht 73.5 in | Wt 248.0 lb

## 2013-09-10 DIAGNOSIS — E782 Mixed hyperlipidemia: Secondary | ICD-10-CM

## 2013-09-10 DIAGNOSIS — R7309 Other abnormal glucose: Secondary | ICD-10-CM

## 2013-09-10 DIAGNOSIS — I10 Essential (primary) hypertension: Secondary | ICD-10-CM

## 2013-09-10 DIAGNOSIS — J309 Allergic rhinitis, unspecified: Secondary | ICD-10-CM

## 2013-09-10 LAB — CBC WITH DIFFERENTIAL/PLATELET
BASOS PCT: 0 % (ref 0–1)
Basophils Absolute: 0 10*3/uL (ref 0.0–0.1)
EOS ABS: 0 10*3/uL (ref 0.0–0.7)
EOS PCT: 0 % (ref 0–5)
HCT: 40.2 % (ref 39.0–52.0)
Hemoglobin: 14.3 g/dL (ref 13.0–17.0)
Lymphocytes Relative: 25 % (ref 12–46)
Lymphs Abs: 1.7 10*3/uL (ref 0.7–4.0)
MCH: 31.8 pg (ref 26.0–34.0)
MCHC: 35.6 g/dL (ref 30.0–36.0)
MCV: 89.3 fL (ref 78.0–100.0)
Monocytes Absolute: 0.8 10*3/uL (ref 0.1–1.0)
Monocytes Relative: 12 % (ref 3–12)
NEUTROS PCT: 63 % (ref 43–77)
Neutro Abs: 4.3 10*3/uL (ref 1.7–7.7)
PLATELETS: 250 10*3/uL (ref 150–400)
RBC: 4.5 MIL/uL (ref 4.22–5.81)
RDW: 14.2 % (ref 11.5–15.5)
WBC: 6.8 10*3/uL (ref 4.0–10.5)

## 2013-09-10 NOTE — Progress Notes (Signed)
Subjective:    Patient ID: Trevor Reynolds, male    DOB: [birth year], 53 y.o.   MRN: [id number]  HPI Comments: 53 yo WM presents for 3 month F/U for HTN, Cholesterol, Pre-Dm, D. deficient . He has been off enalapril x 3 days. He has noted BP good at home. He is keeping busy. He eats healthy for most part.  Last labs CHOL         140   06/12/2013 HDL           63   06/12/2013 LDLCALC       56   06/12/2013 TRIG         103   06/12/2013 CHOLHDL      2.2   06/12/2013 ALT           20   06/12/2013 AST           34   06/12/2013 ALKPHOS       42   06/12/2013 BILITOT      0.4   06/12/2013 CREATININE     1.15   06/12/2013 BUN              22   06/12/2013 NA              141   06/12/2013 K               4.5   06/12/2013 CL              101   06/12/2013 CO2              29   06/12/2013 HGBA1C      6.0   06/12/2013   He notes increased allergy drainage and dry cough x 2-3 weeks. He has tried OTC allergy/ NS without relief.  Hyperlipidemia  Hypertension    Current Outpatient Prescriptions on File Prior to Visit  Medication Sig Dispense Refill  . allopurinol (ZYLOPRIM) 300 MG tablet Take 300 mg by mouth daily.      Marland Kitchen. ALPRAZolam (XANAX) 1 MG tablet TAKE ONE-HALF TO ONE TABLET BY MOUTH THREE TIMES DAILY AS NEEDED.  90 tablet  0  . atenolol (TENORMIN) 100 MG tablet TAKE ONE TABLET BY MOUTH ONCE DAILY  90 tablet  0  . Cholecalciferol (VITAMIN D PO) Take 5,000 Int'l Units by mouth daily.      . CRESTOR 40 MG tablet TAKE ONE TABLET BY MOUTH EVERY DAY  90 tablet  0  . enalapril (VASOTEC) 20 MG tablet Take 20 mg by mouth daily.      . finasteride (PROSCAR) 5 MG tablet TAKE ONE TABLET BY MOUTH EVERY DAY  90 tablet  0  . FLUoxetine (PROZAC) 40 MG capsule TAKE TWO CAPSULES BY MOUTH EVERY DAY FOR MOOD  180 capsule  0  . hydrochlorothiazide (HYDRODIURIL) 25 MG tablet Take 25 mg by mouth daily.      Marland Kitchen. testosterone cypionate (DEPOTESTOTERONE CYPIONATE) 200 MG/ML injection Inject 400 mg into the muscle every 14  (fourteen) days.       No current facility-administered medications on file prior to visit.   Allergies  Allergen Reactions  . Lipitor [Atorvastatin]    Past Medical History  Diagnosis Date  . Hypertension   . Hyperlipidemia   . Other testicular hypofunction   . Gout   . Depression   . Anxiety      Review of Systems  HENT: Positive for postnasal drip.   All other systems reviewed  and are negative.  BP 150/98  Pulse 78  Temp(Src) 98.4 F (36.9 C) (Temporal)  Resp 18  Ht 6' 1.5" (1.867 m)  Wt 248 lb (112.492 kg)  BMI 32.27 kg/m2 Recheck 145/88    Objective:   Physical Exam  Nursing note and vitals reviewed. Constitutional: He is oriented to person, place, and time. He appears well-developed and well-nourished.  HENT:  Head: Normocephalic and atraumatic.  Right Ear: External ear normal.  Left Ear: External ear normal.  Nose: Nose normal.  Mouth/Throat: No oropharyngeal exudate.  Cloudy TM's bilaterally, Cobblestones posterior pharynx   Eyes: Conjunctivae and EOM are normal.  Neck: Normal range of motion. Neck supple. No JVD present. No thyromegaly present.  Cardiovascular: Normal rate, regular rhythm, normal heart sounds and intact distal pulses.   Pulmonary/Chest: Effort normal and breath sounds normal.  Abdominal: Soft. Bowel sounds are normal. He exhibits no distension and no mass. There is no tenderness. There is no rebound and no guarding.  Musculoskeletal: Normal range of motion. He exhibits no edema and no tenderness.  Lymphadenopathy:    He has no cervical adenopathy.  Neurological: He is alert and oriented to person, place, and time. He has normal reflexes. No cranial nerve deficit. Coordination normal.  Skin: Skin is warm and dry.  Psychiatric: He has a normal mood and affect. His behavior is normal. Judgment and thought content normal.          Assessment & Plan:  1.  3 month F/U for HTN, Cholesterol, Pre-Dm, D. Deficient. Needs healthy diet,  cardio QD and obtain healthy weight. Check Labs, Check BP if >130/80 call office . Restart Enalapril and call if BP does not improve  2. Allergic rhinitis- Allegra OTC, increase H2o, allergy hygiene explained. Nazonex SX given use AD

## 2013-09-10 NOTE — Patient Instructions (Signed)
Hypertension As your heart beats, it forces blood through your arteries. This force is your blood pressure. If the pressure is too high, it is called hypertension (HTN) or high blood pressure. HTN is dangerous because you may have it and not know it. High blood pressure may mean that your heart has to work harder to pump blood. Your arteries may be narrow or stiff. The extra work puts you at risk for heart disease, stroke, and other problems.  Blood pressure consists of two numbers, a higher number over a lower, 110/72, for example. It is stated as "110 over 72." The ideal is below 120 for the top number (systolic) and under 80 for the bottom (diastolic). Write down your blood pressure today. You should pay close attention to your blood pressure if you have certain conditions such as:  Heart failure.  Prior heart attack.  Diabetes  Chronic kidney disease.  Prior stroke.  Multiple risk factors for heart disease. To see if you have HTN, your blood pressure should be measured while you are seated with your arm held at the level of the heart. It should be measured at least twice. A one-time elevated blood pressure reading (especially in the Emergency Department) does not mean that you need treatment. There may be conditions in which the blood pressure is different between your right and left arms. It is important to see your caregiver soon for a recheck. Most people have essential hypertension which means that there is not a specific cause. This type of high blood pressure may be lowered by changing lifestyle factors such as:  Stress.  Smoking.  Lack of exercise.  Excessive weight.  Drug/tobacco/alcohol use.  Eating less salt. Most people do not have symptoms from high blood pressure until it has caused damage to the body. Effective treatment can often prevent, delay or reduce that damage. TREATMENT  When a cause has been identified, treatment for high blood pressure is directed at the  cause. There are a large number of medications to treat HTN. These fall into several categories, and your caregiver will help you select the medicines that are best for you. Medications may have side effects. You should review side effects with your caregiver. If your blood pressure stays high after you have made lifestyle changes or started on medicines,   Your medication(s) may need to be changed.  Other problems may need to be addressed.  Be certain you understand your prescriptions, and know how and when to take your medicine.  Be sure to follow up with your caregiver within the time frame advised (usually within two weeks) to have your blood pressure rechecked and to review your medications.  If you are taking more than one medicine to lower your blood pressure, make sure you know how and at what times they should be taken. Taking two medicines at the same time can result in blood pressure that is too low. SEEK IMMEDIATE MEDICAL CARE IF:  You develop a severe headache, blurred or changing vision, or confusion.  You have unusual weakness or numbness, or a faint feeling.  You have severe chest or abdominal pain, vomiting, or breathing problems. MAKE SURE YOU:   Understand these instructions.  Will watch your condition.  Will get help right away if you are not doing well or get worse. Document Released: 05/08/2005 Document Revised: 07/31/2011 Document Reviewed: 12/27/2007 [facility] Patient Information 2014 [place], [place]. Allergic Rhinitis Allergic rhinitis is when the mucous membranes in the nose respond to allergens. Allergens  are particles in the air that cause your body to have an allergic reaction. This causes you to release allergic antibodies. Through a chain of events, these eventually cause you to release histamine into the blood stream. Although meant to protect the body, it is this release of histamine that causes your discomfort, such as frequent sneezing, congestion, and  an itchy, runny nose.  CAUSES  Seasonal allergic rhinitis (hay fever) is caused by pollen allergens that may come from grasses, trees, and weeds. Year-round allergic rhinitis (perennial allergic rhinitis) is caused by allergens such as house dust mites, pet dander, and mold spores.  SYMPTOMS   Nasal stuffiness (congestion).  Itchy, runny nose with sneezing and tearing of the eyes. DIAGNOSIS  Your health care provider can help you determine the allergen or allergens that trigger your symptoms. If you and your health care provider are unable to determine the allergen, skin or blood testing may be used. TREATMENT  Allergic Rhinitis does not have a cure, but it can be controlled by:  Medicines and allergy shots (immunotherapy).  Avoiding the allergen. Hay fever may often be treated with antihistamines in pill or nasal spray forms. Antihistamines block the effects of histamine. There are over-the-counter medicines that may help with nasal congestion and swelling around the eyes. Check with your health care provider before taking or giving this medicine.  If avoiding the allergen or the medicine prescribed do not work, there are many new medicines your health care provider can prescribe. Stronger medicine may be used if initial measures are ineffective. Desensitizing injections can be used if medicine and avoidance does not work. Desensitization is when a patient is given ongoing shots until the body becomes less sensitive to the allergen. Make sure you follow up with your health care provider if problems continue. HOME CARE INSTRUCTIONS It is not possible to completely avoid allergens, but you can reduce your symptoms by taking steps to limit your exposure to them. It helps to know exactly what you are allergic to so that you can avoid your specific triggers. SEEK MEDICAL CARE IF:   You have a fever.  You develop a cough that does not stop easily (persistent).  You have shortness of  breath.  You start wheezing.  Symptoms interfere with normal daily activities. Document Released: 01/31/2001 Document Revised: 02/26/2013 Document Reviewed: 01/13/2013 [facility]eterExitCare Patient Information 2014 [place], [place].

## 2013-09-11 LAB — HEPATIC FUNCTION PANEL
ALK PHOS: 35 U/L — AB (ref 39–117)
ALT: 26 U/L (ref 0–53)
AST: 43 U/L — ABNORMAL HIGH (ref 0–37)
Albumin: 4.9 g/dL (ref 3.5–5.2)
BILIRUBIN DIRECT: 0.2 mg/dL (ref 0.0–0.3)
BILIRUBIN INDIRECT: 0.3 mg/dL (ref 0.2–1.2)
TOTAL PROTEIN: 7.7 g/dL (ref 6.0–8.3)
Total Bilirubin: 0.5 mg/dL (ref 0.2–1.2)

## 2013-09-11 LAB — BASIC METABOLIC PANEL WITH GFR
BUN: 24 mg/dL — ABNORMAL HIGH (ref 6–23)
CALCIUM: 10.1 mg/dL (ref 8.4–10.5)
CO2: 30 meq/L (ref 19–32)
CREATININE: 1.4 mg/dL — AB (ref 0.50–1.35)
Chloride: 98 mEq/L (ref 96–112)
GFR, EST AFRICAN AMERICAN: 66 mL/min
GFR, EST NON AFRICAN AMERICAN: 57 mL/min — AB
Glucose, Bld: 98 mg/dL (ref 70–99)
Potassium: 4.8 mEq/L (ref 3.5–5.3)
SODIUM: 139 meq/L (ref 135–145)

## 2013-09-11 LAB — HEMOGLOBIN A1C
HEMOGLOBIN A1C: 6.2 % — AB (ref <5.7)
Mean Plasma Glucose: 131 mg/dL — ABNORMAL HIGH (ref <117)

## 2013-09-11 LAB — LIPID PANEL
CHOL/HDL RATIO: 1.9 ratio
Cholesterol: 142 mg/dL (ref 0–200)
HDL: 74 mg/dL (ref >39)
LDL CALC: 54 mg/dL (ref 0–99)
Triglycerides: 71 mg/dL (ref <150)
VLDL: 14 mg/dL (ref 0–40)

## 2013-09-11 LAB — INSULIN, FASTING: INSULIN FASTING, SERUM: 25 u[IU]/mL (ref 3–28)

## 2013-09-29 ENCOUNTER — Other Ambulatory Visit: Payer: 59

## 2013-09-29 DIAGNOSIS — R748 Abnormal levels of other serum enzymes: Secondary | ICD-10-CM

## 2013-09-30 LAB — HEPATIC FUNCTION PANEL
ALBUMIN: 4.6 g/dL (ref 3.5–5.2)
ALK PHOS: 36 U/L — AB (ref 39–117)
ALT: 17 U/L (ref 0–53)
AST: 32 U/L (ref 0–37)
BILIRUBIN TOTAL: 0.6 mg/dL (ref 0.2–1.2)
Bilirubin, Direct: 0.1 mg/dL (ref 0.0–0.3)
Indirect Bilirubin: 0.5 mg/dL (ref 0.2–1.2)
TOTAL PROTEIN: 7.1 g/dL (ref 6.0–8.3)

## 2013-10-06 ENCOUNTER — Other Ambulatory Visit: Payer: Self-pay | Admitting: Internal Medicine

## 2013-10-06 MED ORDER — TESTOSTERONE CYPIONATE 200 MG/ML IM SOLN: 400.0000 mg | INTRAMUSCULAR | Status: DC

## 2013-10-08 ENCOUNTER — Other Ambulatory Visit: Payer: Self-pay | Admitting: Internal Medicine

## 2013-10-08 DIAGNOSIS — E291 Testicular hypofunction: Secondary | ICD-10-CM

## 2013-10-08 MED ORDER — TESTOSTERONE CYPIONATE 200 MG/ML IM SOLN: 400.0000 mg | INTRAMUSCULAR | Status: DC

## 2013-10-14 ENCOUNTER — Other Ambulatory Visit: Payer: Self-pay | Admitting: Physician Assistant

## 2013-10-14 DIAGNOSIS — E291 Testicular hypofunction: Secondary | ICD-10-CM

## 2013-10-14 MED ORDER — TESTOSTERONE CYPIONATE 200 MG/ML IM SOLN: 400.0000 mg | INTRAMUSCULAR | Status: DC

## 2013-10-14 MED ORDER — TESTOSTERONE CYPIONATE 200 MG/ML IM SOLN: 400.0000 mg | INTRAMUSCULAR | Status: AC

## 2013-10-21 ENCOUNTER — Other Ambulatory Visit: Payer: Self-pay | Admitting: Emergency Medicine

## 2013-10-21 ENCOUNTER — Other Ambulatory Visit: Payer: Self-pay | Admitting: Physician Assistant

## 2013-11-03 ENCOUNTER — Other Ambulatory Visit: Payer: Self-pay | Admitting: Physician Assistant

## 2013-11-18 ENCOUNTER — Ambulatory Visit (INDEPENDENT_AMBULATORY_CARE_PROVIDER_SITE_OTHER): Payer: 59 | Admitting: Physician Assistant

## 2013-11-18 ENCOUNTER — Encounter: Payer: Self-pay | Admitting: Physician Assistant

## 2013-11-18 VITALS — BP 130/82 | HR 64 | Temp 97.7°F | Resp 16 | Ht 73.5 in | Wt 247.0 lb

## 2013-11-18 DIAGNOSIS — M542 Cervicalgia: Secondary | ICD-10-CM

## 2013-11-18 MED ORDER — HYDROCODONE-ACETAMINOPHEN 5-325 MG PO TABS: 1.0000 | ORAL_TABLET | Freq: Four times a day (QID) | ORAL | Status: DC | PRN

## 2013-11-18 MED ORDER — PREDNISONE 20 MG PO TABS: ORAL_TABLET | ORAL | Status: DC

## 2013-11-18 MED ORDER — CYCLOBENZAPRINE HCL 10 MG PO TABS: 10.0000 mg | ORAL_TABLET | Freq: Three times a day (TID) | ORAL | Status: DC | PRN

## 2013-11-18 NOTE — Progress Notes (Signed)
   Subjective:    Patient ID: Trevor FreezeBobby T Pereida, male    DOB: 1961-03-26, 53 y.o.   MRN: [id number]  HPI 53 y.o. male with neck pain s/p fall on Sunday, 11/16/2013. He was outside feeding his dog in flip flops when he hit water and fell on his left side, has a scrape on his left arm and left head, no LOC. Currently has pain in neck, very stiff, painful to turn, R>L. Denies numbness tingling, weakness.    Review of Systems  HENT: Negative.   Respiratory: Negative.   Cardiovascular: Negative.   Gastrointestinal: Negative.   Genitourinary: Negative.   Musculoskeletal: Positive for myalgias, neck pain and neck stiffness. Negative for arthralgias, back pain, gait problem and joint swelling.  Neurological: Negative.        Objective:   Physical Exam  Constitutional: He is oriented to person, place, and time. He appears well-developed and well-nourished.  HENT:  Head: Normocephalic and atraumatic.  Eyes: Conjunctivae are normal. Pupils are equal, round, and reactive to light.  Neck: Neck supple.  Cardiovascular: Normal rate and regular rhythm.   Pulmonary/Chest: Effort normal and breath sounds normal.  Musculoskeletal:  decreased flexion/extension due to pain, negative C7 tenderness, + paraspinal muscle tenderness R>L, normal sensation, reflexes, and pulses distal.   Lymphadenopathy:    He has no cervical adenopathy.  Neurological: He is alert and oriented to person, place, and time. He has normal reflexes. No cranial nerve deficit. Coordination normal.  Skin: Skin is warm and dry. No rash noted.        Assessment & Plan:  1. Neck pain, acute RICE, NSAIDS, exercises given, if not better get xray and PT referral or ortho referral.  Prednisone, Norco, flexeril

## 2013-11-18 NOTE — Patient Instructions (Signed)

## 2013-11-24 ENCOUNTER — Other Ambulatory Visit: Payer: Self-pay | Admitting: Internal Medicine

## 2013-11-24 ENCOUNTER — Other Ambulatory Visit: Payer: Self-pay | Admitting: Physician Assistant

## 2013-12-02 ENCOUNTER — Other Ambulatory Visit: Payer: Self-pay | Admitting: Physician Assistant

## 2013-12-16 ENCOUNTER — Encounter: Payer: Self-pay | Admitting: Internal Medicine

## 2013-12-16 ENCOUNTER — Ambulatory Visit (INDEPENDENT_AMBULATORY_CARE_PROVIDER_SITE_OTHER): Payer: 59 | Admitting: Internal Medicine

## 2013-12-16 VITALS — BP 122/80 | HR 76 | Temp 98.2°F | Resp 16 | Ht 73.5 in | Wt 253.4 lb

## 2013-12-16 DIAGNOSIS — R740 Nonspecific elevation of levels of transaminase and lactic acid dehydrogenase [LDH]: Secondary | ICD-10-CM

## 2013-12-16 DIAGNOSIS — R7402 Elevation of levels of lactic acid dehydrogenase (LDH): Secondary | ICD-10-CM

## 2013-12-16 DIAGNOSIS — Z0000 Encounter for general adult medical examination without abnormal findings: Secondary | ICD-10-CM

## 2013-12-16 DIAGNOSIS — Z125 Encounter for screening for malignant neoplasm of prostate: Secondary | ICD-10-CM

## 2013-12-16 DIAGNOSIS — E559 Vitamin D deficiency, unspecified: Secondary | ICD-10-CM

## 2013-12-16 DIAGNOSIS — I10 Essential (primary) hypertension: Secondary | ICD-10-CM

## 2013-12-16 DIAGNOSIS — Z1212 Encounter for screening for malignant neoplasm of rectum: Secondary | ICD-10-CM

## 2013-12-16 DIAGNOSIS — Z113 Encounter for screening for infections with a predominantly sexual mode of transmission: Secondary | ICD-10-CM

## 2013-12-16 DIAGNOSIS — Z111 Encounter for screening for respiratory tuberculosis: Secondary | ICD-10-CM

## 2013-12-16 LAB — CBC WITH DIFFERENTIAL/PLATELET
Basophils Absolute: 0 10*3/uL (ref 0.0–0.1)
Basophils Relative: 0 % (ref 0–1)
EOS ABS: 0.1 10*3/uL (ref 0.0–0.7)
Eosinophils Relative: 1 % (ref 0–5)
HCT: 37.4 % — ABNORMAL LOW (ref 39.0–52.0)
Hemoglobin: 12.8 g/dL — ABNORMAL LOW (ref 13.0–17.0)
Lymphocytes Relative: 24 % (ref 12–46)
Lymphs Abs: 1.4 10*3/uL (ref 0.7–4.0)
MCH: 30.7 pg (ref 26.0–34.0)
MCHC: 34.2 g/dL (ref 30.0–36.0)
MCV: 89.7 fL (ref 78.0–100.0)
MONOS PCT: 9 % (ref 3–12)
Monocytes Absolute: 0.5 10*3/uL (ref 0.1–1.0)
NEUTROS PCT: 66 % (ref 43–77)
Neutro Abs: 3.8 10*3/uL (ref 1.7–7.7)
PLATELETS: 216 10*3/uL (ref 150–400)
RBC: 4.17 MIL/uL — ABNORMAL LOW (ref 4.22–5.81)
RDW: 14.6 % (ref 11.5–15.5)
WBC: 5.8 10*3/uL (ref 4.0–10.5)

## 2013-12-16 NOTE — Progress Notes (Signed)
Patient ID: Trevor Reynolds, male   DOB: 08-Oct-1960, 53 y.o.   MRN: [id number]   Annual Screening Comprehensive Examination  This very nice 53 y.o.MWM presents for complete physical.  Patient has been followed for HTN, Diabetes  Prediabetes, Hyperlipidemia, Testosterone and Vitamin D Deficiency.   HTN predates since 1997. Patient's BP has been controlled at home.Today's BP: 122/80 mmHg. Patient denies any cardiac symptoms as chest pain, palpitations, shortness of breath, dizziness or ankle swelling.   Patient's hyperlipidemia is controlled with diet and medications. Patient denies myalgias or other medication SE's. Last lipids were at goal. Lab Results  Component Value Date   CHOL 142 09/10/2013   HDL 74 09/10/2013   LDLCALC 54 09/10/2013   TRIG 71 09/10/2013   CHOLHDL 1.9 09/10/2013    Patient has prediabetes since    and last A1c was 6.2% in Apr 2015.  Patient denies reactive hypoglycemic symptoms, visual blurring, diabetic polys or paresthesias.    Finally, patient has history of Vitamin D Deficiency of 35 in 2008 and last vitamin D was 95 in Jan 2015.   Medication Sig  . allopurinol  300 MG tablet Take 300 mg by mouth daily.  Marland Kitchen ALPRAZolam1 MG tablet TAKE 1/2 -1 tab  THREE TIMES DAILY   . aspirin 81 MG tablet Take 81 mg by mouth daily.  Marland Kitchen atenolol 100 MG tablet TAKE ONE TABLET BY MOUTH ONCE DAILY  . CRESTOR 40 MG tablet TAKE ONE TABLET BY MOUTH EVERY DAY  . enalapril  20 MG tablet Take 20 mg by mouth daily.  . finasteride 5 MG tablet TAKE ONE TABLET  ONCE DAILY  . FLUoxetine  40 MG capsule TAKE TWO CAPSULES ONCE DAILY FOR  MOOD  . Hctz 25 MG tablet Take 25 mg by mouth daily.  . DEPOTESTOSTERONE  200 MG Inject 2 mLs  into the muscle every 14  days.  (last ~ 3 weeks ago)   Allergies  Allergen Reactions  . Lipitor [Atorvastatin]    Past Medical History  Diagnosis Date  . Hypertension   . Hyperlipidemia   . Other testicular hypofunction   . Gout   . Depression   . Anxiety     Past Surgical History  Procedure Laterality Date  . Orif ankle fracture Right 2003   Family History  Problem Relation Age of Onset  . Diabetes Father   . Heart disease Father   . Hypertension Father   . Heart attack Father    History   Social History  . Marital Status: Divorced    Spouse Name: N/A    Number of Children: N/A  . Years of Education: N/A   Occupational History  . Engineer, site.   Social History Main Topics  . Smoking status: Former Smoker    Quit date: 09/10/1988  . Smokeless tobacco: Former Neurosurgeon    Types: Snuff  . Alcohol Use: 5.0 oz/week    10 drink(s) per week  . Drug Use: No  . Sexual Activity: Not on file    ROS Constitutional: Denies fever, chills, weight loss/gain, headaches, insomnia, fatigue, night sweats or change in appetite. Eyes: Denies redness, blurred vision, diplopia, discharge, itchy or watery eyes.  ENT: Denies discharge, congestion, post nasal drip, epistaxis, sore throat, earache, hearing loss, dental pain, Tinnitus, Vertigo, Sinus pain or snoring.  Cardio: Denies chest pain, palpitations, irregular heartbeat, syncope, dyspnea, diaphoresis, orthopnea, PND, claudication or edema Respiratory: denies cough, dyspnea, DOE, pleurisy, hoarseness, laryngitis or wheezing.  Gastrointestinal: Denies dysphagia,  heartburn, reflux, water brash, pain, cramps, nausea, vomiting, bloating, diarrhea, constipation, hematemesis, melena, hematochezia, jaundice or hemorrhoids Genitourinary: Denies dysuria, frequency, urgency, nocturia, hesitancy, discharge, hematuria or flank pain Musculoskeletal: Denies arthralgia, myalgia, stiffness, Jt. Swelling, pain, limp or strain/sprain. Denies Falls. Skin: Denies puritis, rash, hives, warts, acne, eczema or change in skin lesion Neuro: No weakness, tremor, incoordination, spasms, paresthesia or pain Psychiatric: Denies confusion, memory loss or sensory loss. Denies Depression. Endocrine: Denies change in weight,  skin, hair change, nocturia, and paresthesia, diabetic polys, visual blurring or hyper / hypo glycemic episodes.  Heme/Lymph: No excessive bleeding, bruising or enlarged lymph nodes.  Physical Exam  BP 122/80  Pulse 76  Temp(Src) 98.2 F (36.8 C) (Temporal)  Resp 16  Ht 6' 1.5" (1.867 m)  Wt 253 lb 6.4 oz (114.941 kg)  BMI 32.98 kg/m2  General Appearance: Well nourished, in no apparent distress. Eyes: PERRLA, EOMs, conjunctiva no swelling or erythema, normal fundi and vessels. Sinuses: No frontal/maxillary tenderness ENT/Mouth: EACs patent / TMs  nl. Nares clear without erythema, swelling, mucoid exudates. Oral hygiene is good. No erythema, swelling, or exudate. Tongue normal, non-obstructing. Tonsils not swollen or erythematous. Hearing normal.  Neck: Supple, thyroid normal. No bruits, nodes or JVD. Respiratory: Respiratory effort normal.  BS equal and clear bilateral without rales, rhonci, wheezing or stridor. Cardio: Heart sounds are normal with regular rate and rhythm and no murmurs, rubs or gallops. Peripheral pulses are normal and equal bilaterally without edema. No aortic or femoral bruits. Chest: symmetric with normal excursions and percussion.  Abdomen: Flat, soft, with bowl sounds. Nontender, no guarding, rebound, hernias, masses, or organomegaly.  Lymphatics: Non tender without lymphadenopathy.  Genitourinary: No hernias.Testes nl. DRE - prostate nl for age - smooth & firm w/o nodules. Musculoskeletal: Full ROM all peripheral extremities, joint stability, 5/5 strength, and normal gait. Skin: Warm and dry without rashes, lesions, cyanosis, clubbing or  ecchymosis.  Neuro: Cranial nerves intact, reflexes equal bilaterally. Normal muscle tone, no cerebellar symptoms. Sensation intact.  Pysch: Awake and oriented X 3 with normal affect, insight and judgment appropriate.  Assessment and Plan  1. Annual Screening Examination 2. Hypertension  3. Hyperlipidemia 4. Pre Diabetes 5.  Vitamin D Deficiency 6. Testosterone Deficiency  Continue prudent diet as discussed, weight control, BP monitoring, regular exercise, and medications as discussed.  Discussed med effects and SE's. Routine screening labs and tests as requested with regular follow-up as recommended.

## 2013-12-16 NOTE — Patient Instructions (Signed)

## 2013-12-17 LAB — HEPATITIS B SURFACE ANTIBODY,QUALITATIVE: Hep B S Ab: NEGATIVE

## 2013-12-17 LAB — HEPATIC FUNCTION PANEL
ALT: 23 U/L (ref 0–53)
AST: 39 U/L — ABNORMAL HIGH (ref 0–37)
Albumin: 4.1 g/dL (ref 3.5–5.2)
Alkaline Phosphatase: 39 U/L (ref 39–117)
BILIRUBIN DIRECT: 0.1 mg/dL (ref 0.0–0.3)
Indirect Bilirubin: 0.3 mg/dL (ref 0.2–1.2)
TOTAL PROTEIN: 6.6 g/dL (ref 6.0–8.3)
Total Bilirubin: 0.4 mg/dL (ref 0.2–1.2)

## 2013-12-17 LAB — BASIC METABOLIC PANEL WITH GFR
BUN: 20 mg/dL (ref 6–23)
CALCIUM: 9 mg/dL (ref 8.4–10.5)
CO2: 28 mEq/L (ref 19–32)
Chloride: 99 mEq/L (ref 96–112)
Creat: 1.16 mg/dL (ref 0.50–1.35)
GFR, EST AFRICAN AMERICAN: 83 mL/min
GFR, Est Non African American: 71 mL/min
GLUCOSE: 92 mg/dL (ref 70–99)
Potassium: 4.5 mEq/L (ref 3.5–5.3)
SODIUM: 139 meq/L (ref 135–145)

## 2013-12-17 LAB — PSA: PSA: 0.15 ng/mL (ref <=4.00)

## 2013-12-17 LAB — TESTOSTERONE: Testosterone: 362 ng/dL (ref 300–890)

## 2013-12-17 LAB — LIPID PANEL
CHOL/HDL RATIO: 2 ratio
CHOLESTEROL: 125 mg/dL (ref 0–200)
HDL: 62 mg/dL (ref >39)
LDL Cholesterol: 47 mg/dL (ref 0–99)
Triglycerides: 80 mg/dL (ref <150)
VLDL: 16 mg/dL (ref 0–40)

## 2013-12-17 LAB — URINALYSIS, MICROSCOPIC ONLY
Bacteria, UA: NONE SEEN
Casts: NONE SEEN
Crystals: NONE SEEN
Squamous Epithelial / LPF: NONE SEEN

## 2013-12-17 LAB — RPR

## 2013-12-17 LAB — HEPATITIS A ANTIBODY, TOTAL: Hep A Total Ab: NONREACTIVE

## 2013-12-17 LAB — VITAMIN B12: Vitamin B-12: 371 pg/mL (ref 211–911)

## 2013-12-17 LAB — HEPATITIS C ANTIBODY: HCV AB: NEGATIVE

## 2013-12-17 LAB — MAGNESIUM: Magnesium: 1.1 mg/dL — ABNORMAL LOW (ref 1.5–2.5)

## 2013-12-17 LAB — MICROALBUMIN / CREATININE URINE RATIO
Creatinine, Urine: 183.6 mg/dL
MICROALB UR: 1.76 mg/dL (ref 0.00–1.89)
Microalb Creat Ratio: 9.6 mg/g (ref 0.0–30.0)

## 2013-12-17 LAB — HEMOGLOBIN A1C
HEMOGLOBIN A1C: 6.1 % — AB (ref <5.7)
MEAN PLASMA GLUCOSE: 128 mg/dL — AB (ref <117)

## 2013-12-17 LAB — HIV ANTIBODY (ROUTINE TESTING W REFLEX): HIV: NONREACTIVE

## 2013-12-17 LAB — INSULIN, FASTING: Insulin fasting, serum: 44 u[IU]/mL — ABNORMAL HIGH (ref 3–28)

## 2013-12-17 LAB — VITAMIN D 25 HYDROXY (VIT D DEFICIENCY, FRACTURES): Vit D, 25-Hydroxy: 117 ng/mL — ABNORMAL HIGH (ref 30–89)

## 2013-12-17 LAB — TSH: TSH: 0.866 u[IU]/mL (ref 0.350–4.500)

## 2013-12-17 LAB — HEPATITIS B CORE ANTIBODY, TOTAL: Hep B Core Total Ab: NONREACTIVE

## 2013-12-18 LAB — HEPATITIS B E ANTIBODY: HEPATITIS BE ANTIBODY: NONREACTIVE

## 2013-12-22 ENCOUNTER — Other Ambulatory Visit: Payer: Self-pay | Admitting: Emergency Medicine

## 2013-12-22 LAB — TB SKIN TEST
Induration: 0 mm
TB SKIN TEST: NEGATIVE

## 2013-12-29 ENCOUNTER — Other Ambulatory Visit: Payer: Self-pay | Admitting: *Deleted

## 2013-12-29 MED ORDER — ALLOPURINOL 300 MG PO TABS: 300.0000 mg | ORAL_TABLET | Freq: Every day | ORAL | Status: AC

## 2014-01-11 ENCOUNTER — Other Ambulatory Visit: Payer: Self-pay | Admitting: Physician Assistant

## 2014-01-16 ENCOUNTER — Encounter: Payer: Self-pay | Admitting: Internal Medicine

## 2014-01-20 DEATH — deceased

## 2014-03-18 ENCOUNTER — Ambulatory Visit: Payer: Self-pay | Admitting: Physician Assistant

## 2014-06-22 ENCOUNTER — Ambulatory Visit: Payer: Self-pay | Admitting: Internal Medicine

## 2014-12-23 ENCOUNTER — Encounter: Payer: Self-pay | Admitting: Internal Medicine
